# Patient Record
Sex: Male | Born: 2005 | Race: Asian | Hispanic: No | Marital: Single | Smoking: Never smoker
Health system: Southern US, Community
[De-identification: ages and names within clinical notes are randomized; demographics above are authoritative.]

---
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
## 2024-01-05 ENCOUNTER — Ambulatory Visit (HOSPITAL_COMMUNITY)
Admission: EM | Admit: 2024-01-05 | Discharge: 2024-01-05 | Disposition: A | Discharge status: Home / Self Care | Attending: Family Medicine | Admitting: Family Medicine

## 2024-01-05 ENCOUNTER — Encounter (HOSPITAL_COMMUNITY): Payer: Self-pay

## 2024-01-05 DIAGNOSIS — K120 Recurrent oral aphthae: Secondary | ICD-10-CM

## 2024-01-05 MED ORDER — HYDROCODONE-ACETAMINOPHEN 5-325 MG PO TABS: 1.0000 | ORAL_TABLET | Freq: Four times a day (QID) | ORAL | 0 refills | Status: AC | PRN

## 2024-01-05 MED ORDER — VALACYCLOVIR HCL 1 G PO TABS: 1000.0000 mg | ORAL_TABLET | Freq: Three times a day (TID) | ORAL | 0 refills | Status: AC

## 2024-01-05 MED ORDER — CHLORHEXIDINE GLUCONATE 0.12 % MT SOLN: 15.0000 mL | Freq: Two times a day (BID) | OROMUCOSAL | 0 refills | Status: AC

## 2024-01-05 NOTE — Discharge Instructions (Signed)
 Be aware, you have been prescribed pain medications that may cause drowsiness. While taking this medication, do not take any other medications containing acetaminophen (Tylenol). Do not combine with alcohol or recreational drugs. Please do not drive, operate heavy machinery, or take part in activities that require making important decisions while on this medication as your judgement may be clouded.  You may use over the counter ibuprofen as needed.  For a sore throat, over the counter products such as [employer] Peroxyl Mouth Sore Rinse or Chloraseptic Sore Throat Spray may provide some temporary relief.

## 2024-01-05 NOTE — ED Provider Notes (Signed)
  [facility]   [id number] 01/05/24 Arrival Time: 1504  ASSESSMENT & PLAN:  1. Recurrent aphthous stomatitis    ENT referral placed.  Trial of medications below.  Meds ordered this encounter  Medications   chlorhexidine (PERIDEX) 0.12 % solution    Sig: Use as directed 15 mLs in the mouth or throat 2 (two) times daily.    Dispense:  120 mL    Refill:  0   HYDROcodone-acetaminophen (NORCO/VICODIN) 5-325 MG tablet    Sig: Take 1 tablet by mouth every 6 (six) hours as needed for moderate pain (pain score 4-6) or severe pain (pain score 7-10).    Dispense:  8 tablet    Refill:  0   valACYclovir (VALTREX) 1000 MG tablet    Sig: Take 1 tablet (1,000 mg total) by mouth 3 (three) times daily.    Dispense:  21 tablet    Refill:  0   [place] Controlled Substances Registry consulted for this patient. I feel the risk/benefit ratio today is favorable for proceeding with this prescription for a controlled substance. Medication sedation precautions given.    Follow-up Information     Schedule an appointment as soon as possible for a visit  with Dahlia Byes, MD.   Specialty: Pediatrics Why: For follow up. Contact information: [address] [place] [address] [phone]                 Reviewed expectations re: course of current medical issues. Questions answered. Outlined signs and symptoms indicating need for more acute intervention. Understanding verbalized. After Visit Summary given.   SUBJECTIVE: History from: Patient. Dylan Franklin is a 18 y.o. male. Patient presents to the office for mouth lesion x 1 month. Patient reports this is a recurrent issue for him. H/O similar on/off for years. No known trigger. Denies: fever. Normal PO intake without n/v/d.  OBJECTIVE:  Vitals:   01/05/24 1615  BP: 114/80  Pulse: 86  Resp: 16  Temp: 98.3 F (36.8 C)  TempSrc: Oral  SpO2: 96%  Weight: 59.6 kg    General appearance: alert; no  distress Eyes: PERRLA; EOMI; conjunctiva normal HENT: [place]; AT; large aphthous ulcer of R posterior throat Neck: supple  Lungs: speaks full sentences without difficulty; unlabored Extremities: no edema Skin: warm and dry Neurologic: normal gait Psychological: alert and cooperative; normal mood and affect  No Known Allergies  History reviewed. No pertinent past medical history. Social History   Socioeconomic History   Marital status: Single    Spouse name: Not on file   Number of children: Not on file   Years of education: Not on file   Highest education level: Not on file  Occupational History   Not on file  Tobacco Use   Smoking status: Never   Smokeless tobacco: Never  Substance and Sexual Activity   Alcohol use: Never   Drug use: Never   Sexual activity: Not on file  Other Topics Concern   Not on file  Social History Narrative   Not on file   Social Drivers of Health   Financial Resource Strain: Not on file  Food Insecurity: Not on file  Transportation Needs: Not on file  Physical Activity: Not on file  Stress: Not on file  Social Connections: Not on file  Intimate Partner Violence: Not on file   History reviewed. No pertinent family history. History reviewed. No pertinent surgical history.   Mardella Layman, MD 01/05/24 [phone]

## 2024-01-05 NOTE — ED Triage Notes (Signed)
 Patient presents to the office for mouth lesion x 1 month. Patient reports this is a recurrent issue for him.

## 2024-01-28 ENCOUNTER — Encounter (INDEPENDENT_AMBULATORY_CARE_PROVIDER_SITE_OTHER): Payer: Self-pay | Admitting: Otolaryngology
